# Patient Record
Sex: Male | Born: 1985 | Race: Black or African American | Hispanic: No | Marital: Single | Smoking: Current every day smoker
Health system: Southern US, Community
[De-identification: ages and names within clinical notes are randomized; demographics above are authoritative.]

---

## 2004-02-15 ENCOUNTER — Emergency Department (HOSPITAL_COMMUNITY): Admission: EM | Admit: 2004-02-15 | Discharge: 2004-02-15 | Payer: Self-pay

## 2005-03-07 ENCOUNTER — Emergency Department (HOSPITAL_COMMUNITY): Admission: EM | Admit: 2005-03-07 | Discharge: 2005-03-07 | Payer: Self-pay | Admitting: Emergency Medicine

## 2005-06-20 ENCOUNTER — Emergency Department (HOSPITAL_COMMUNITY): Admission: EM | Admit: 2005-06-20 | Discharge: 2005-06-20 | Payer: Self-pay | Admitting: Emergency Medicine

## 2005-08-14 ENCOUNTER — Emergency Department (HOSPITAL_COMMUNITY): Admission: EM | Admit: 2005-08-14 | Discharge: 2005-08-14 | Payer: Self-pay | Admitting: Emergency Medicine

## 2006-03-25 ENCOUNTER — Emergency Department (HOSPITAL_COMMUNITY): Admission: EM | Admit: 2006-03-25 | Discharge: 2006-03-25 | Payer: Self-pay | Admitting: Family Medicine

## 2006-10-24 ENCOUNTER — Emergency Department (HOSPITAL_COMMUNITY): Admission: EM | Admit: 2006-10-24 | Discharge: 2006-10-24 | Payer: Self-pay | Admitting: Family Medicine

## 2006-12-26 ENCOUNTER — Emergency Department (HOSPITAL_COMMUNITY): Admission: EM | Admit: 2006-12-26 | Discharge: 2006-12-26 | Payer: Self-pay | Admitting: Emergency Medicine

## 2007-06-18 ENCOUNTER — Emergency Department (HOSPITAL_COMMUNITY): Admission: EM | Admit: 2007-06-18 | Discharge: 2007-06-18 | Payer: Self-pay | Admitting: Emergency Medicine

## 2007-07-18 ENCOUNTER — Emergency Department (HOSPITAL_COMMUNITY): Admission: EM | Admit: 2007-07-18 | Discharge: 2007-07-18 | Payer: Self-pay | Admitting: Emergency Medicine

## 2007-08-24 ENCOUNTER — Emergency Department (HOSPITAL_COMMUNITY): Admission: EM | Admit: 2007-08-24 | Discharge: 2007-08-24 | Payer: Self-pay | Admitting: Family Medicine

## 2009-05-12 ENCOUNTER — Emergency Department (HOSPITAL_COMMUNITY): Admission: EM | Admit: 2009-05-12 | Discharge: 2009-05-12 | Payer: Self-pay | Admitting: Emergency Medicine

## 2011-02-09 LAB — URINE CULTURE: Colony Count: 10000

## 2011-02-09 LAB — POCT URINALYSIS DIP (DEVICE)
Glucose, UA: NEGATIVE
Nitrite: NEGATIVE
Operator id: 247071
Protein, ur: 30 — AB
Specific Gravity, Urine: 1.025
Urobilinogen, UA: 1
pH: 6

## 2011-02-09 LAB — GC/CHLAMYDIA PROBE AMP, GENITAL
Chlamydia, DNA Probe: NEGATIVE
GC Probe Amp, Genital: NEGATIVE

## 2011-03-01 LAB — GC/CHLAMYDIA PROBE AMP, GENITAL
Chlamydia, DNA Probe: NEGATIVE
GC Probe Amp, Genital: POSITIVE — AB

## 2011-03-04 LAB — GC/CHLAMYDIA PROBE AMP, GENITAL
Chlamydia, DNA Probe: NEGATIVE
GC Probe Amp, Genital: NEGATIVE

## 2011-03-04 LAB — RPR: RPR Ser Ql: NONREACTIVE

## 2011-03-04 LAB — HIV ANTIBODY (ROUTINE TESTING W REFLEX): HIV: NONREACTIVE

## 2011-05-01 ENCOUNTER — Encounter: Payer: Self-pay | Admitting: Emergency Medicine

## 2011-05-01 ENCOUNTER — Emergency Department (HOSPITAL_COMMUNITY)
Admission: EM | Admit: 2011-05-01 | Discharge: 2011-05-02 | Disposition: A | Discharge status: Home / Self Care | Payer: Self-pay | Attending: Emergency Medicine | Admitting: Emergency Medicine

## 2011-05-01 DIAGNOSIS — R509 Fever, unspecified: Secondary | ICD-10-CM | POA: Insufficient documentation

## 2011-05-01 DIAGNOSIS — J069 Acute upper respiratory infection, unspecified: Secondary | ICD-10-CM | POA: Insufficient documentation

## 2011-05-01 DIAGNOSIS — R0602 Shortness of breath: Secondary | ICD-10-CM | POA: Insufficient documentation

## 2011-05-01 MED ORDER — ACETAMINOPHEN 325 MG PO TABS
650.0000 mg | ORAL_TABLET | Freq: Once | ORAL | Status: AC
  Administered 2011-05-02: 650 mg via ORAL
  Filled 2011-05-01: qty 2

## 2011-05-01 MED ORDER — ALBUTEROL SULFATE (5 MG/ML) 0.5% IN NEBU
5.0000 mg | INHALATION_SOLUTION | Freq: Once | RESPIRATORY_TRACT | Status: AC
  Administered 2011-05-02: 5 mg via RESPIRATORY_TRACT
  Filled 2011-05-01: qty 1

## 2011-05-01 MED ORDER — PREDNISONE 20 MG PO TABS
60.0000 mg | ORAL_TABLET | Freq: Once | ORAL | Status: AC
  Administered 2011-05-02: 60 mg via ORAL
  Filled 2011-05-01: qty 3
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
## 2011-05-02 ENCOUNTER — Emergency Department (HOSPITAL_COMMUNITY): Payer: Self-pay

## 2011-05-02 MED ORDER — ALBUTEROL SULFATE HFA 108 (90 BASE) MCG/ACT IN AERS: 2.0000 | INHALATION_SPRAY | RESPIRATORY_TRACT | Status: DC | PRN

## 2011-05-02 MED ORDER — PREDNISONE 20 MG PO TABS: 60.0000 mg | ORAL_TABLET | Freq: Every day | ORAL | Status: AC

## 2011-05-02 NOTE — ED Notes (Signed)
Pt states he feels much better after breathing tx, even after ambulating.  States sob greatly decreased.

## 2011-05-02 NOTE — ED Notes (Signed)
Pt or family not in room.  They were seen walking out past CDU.

## 2011-05-02 NOTE — ED Notes (Signed)
Dr Dierdre Highman and charge RN is aware that pt has eloped.

## 2011-05-02 NOTE — ED Notes (Signed)
It appears pt has left.

## 2011-05-05 ENCOUNTER — Encounter (HOSPITAL_COMMUNITY): Payer: Self-pay | Admitting: Emergency Medicine

## 2011-05-05 ENCOUNTER — Emergency Department (INDEPENDENT_AMBULATORY_CARE_PROVIDER_SITE_OTHER)
Admission: EM | Admit: 2011-05-05 | Discharge: 2011-05-05 | Disposition: A | Discharge status: Home / Self Care | Payer: Self-pay | Source: Home / Self Care | Attending: Family Medicine | Admitting: Family Medicine

## 2011-05-05 DIAGNOSIS — J40 Bronchitis, not specified as acute or chronic: Secondary | ICD-10-CM

## 2011-05-05 MED ORDER — ALBUTEROL SULFATE HFA 108 (90 BASE) MCG/ACT IN AERS
2.0000 | INHALATION_SPRAY | RESPIRATORY_TRACT | Status: DC | PRN
  Administered 2011-05-05: 2 via RESPIRATORY_TRACT

## 2011-05-05 MED ORDER — ALBUTEROL SULFATE HFA 108 (90 BASE) MCG/ACT IN AERS
INHALATION_SPRAY | RESPIRATORY_TRACT | Status: AC
  Filled 2011-05-05: qty 6.7

## 2011-05-05 MED ORDER — PREDNISONE 20 MG PO TABS: 40.0000 mg | ORAL_TABLET | Freq: Every day | ORAL | Status: AC
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
## 2011-05-18 ENCOUNTER — Encounter (HOSPITAL_COMMUNITY): Payer: Self-pay | Admitting: *Deleted

## 2011-05-18 ENCOUNTER — Emergency Department (INDEPENDENT_AMBULATORY_CARE_PROVIDER_SITE_OTHER)
Admission: EM | Admit: 2011-05-18 | Discharge: 2011-05-18 | Disposition: A | Discharge status: Home / Self Care | Payer: Self-pay | Source: Home / Self Care

## 2011-05-18 DIAGNOSIS — J45909 Unspecified asthma, uncomplicated: Secondary | ICD-10-CM

## 2011-05-18 MED ORDER — ALBUTEROL SULFATE (5 MG/ML) 0.5% IN NEBU
INHALATION_SOLUTION | RESPIRATORY_TRACT | Status: AC
  Filled 2011-05-18: qty 1

## 2011-05-18 MED ORDER — SODIUM CHLORIDE 0.9 % IJ SOLN
INTRAMUSCULAR | Status: AC
  Filled 2011-05-18: qty 3

## 2011-05-18 MED ORDER — ALBUTEROL SULFATE (5 MG/ML) 0.5% IN NEBU
5.0000 mg | INHALATION_SOLUTION | Freq: Once | RESPIRATORY_TRACT | Status: AC
  Administered 2011-05-18: 5 mg via RESPIRATORY_TRACT

## 2011-05-18 MED ORDER — IPRATROPIUM BROMIDE 0.02 % IN SOLN
0.5000 mg | Freq: Once | RESPIRATORY_TRACT | Status: AC
  Administered 2011-05-18: 0.5 mg via RESPIRATORY_TRACT

## 2011-05-18 MED ORDER — ALBUTEROL SULFATE HFA 108 (90 BASE) MCG/ACT IN AERS: 2.0000 | INHALATION_SPRAY | RESPIRATORY_TRACT | Status: DC | PRN

## 2011-05-18 MED ORDER — FLUTICASONE PROPIONATE HFA 110 MCG/ACT IN AERO: 2.0000 | INHALATION_SPRAY | Freq: Two times a day (BID) | RESPIRATORY_TRACT | Status: DC
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
## 2011-05-19 ENCOUNTER — Encounter (HOSPITAL_COMMUNITY): Payer: Self-pay | Admitting: Emergency Medicine

## 2011-05-19 ENCOUNTER — Emergency Department (HOSPITAL_COMMUNITY)
Admission: EM | Admit: 2011-05-19 | Discharge: 2011-05-19 | Disposition: A | Discharge status: Home / Self Care | Payer: Self-pay | Attending: Emergency Medicine | Admitting: Emergency Medicine

## 2011-05-19 DIAGNOSIS — J45901 Unspecified asthma with (acute) exacerbation: Secondary | ICD-10-CM | POA: Insufficient documentation

## 2011-05-19 DIAGNOSIS — J069 Acute upper respiratory infection, unspecified: Secondary | ICD-10-CM | POA: Insufficient documentation

## 2011-05-19 DIAGNOSIS — R0602 Shortness of breath: Secondary | ICD-10-CM | POA: Insufficient documentation

## 2011-05-19 MED ORDER — PREDNISONE 20 MG PO TABS: ORAL_TABLET | ORAL | Status: AC

## 2011-05-19 MED ORDER — ALBUTEROL SULFATE HFA 108 (90 BASE) MCG/ACT IN AERS
2.0000 | INHALATION_SPRAY | RESPIRATORY_TRACT | Status: DC | PRN
  Administered 2011-05-19: 2 via RESPIRATORY_TRACT
  Filled 2011-05-19: qty 6.7

## 2011-05-19 NOTE — ED Notes (Signed)
Mask given to pt to wear.  
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
## 2011-05-19 NOTE — ED Notes (Signed)
3-4  Weeks went to ucc and was seen but did not have $ to get rx filled. Pt  States was dx w/ bronchititis

## 2011-05-24 NOTE — ED Provider Notes (Signed)
Medical screening examination/treatment/procedure(s) were performed by non-physician practitioner and as supervising physician I was immediately available for consultation/collaboration.  Raynald Blend, MD 05/24/11 [phone]

## 2011-06-09 ENCOUNTER — Encounter (HOSPITAL_COMMUNITY): Payer: Self-pay

## 2011-06-09 ENCOUNTER — Emergency Department (INDEPENDENT_AMBULATORY_CARE_PROVIDER_SITE_OTHER)
Admission: EM | Admit: 2011-06-09 | Discharge: 2011-06-09 | Disposition: A | Discharge status: Home / Self Care | Payer: Self-pay | Source: Home / Self Care | Attending: Emergency Medicine | Admitting: Emergency Medicine

## 2011-06-09 DIAGNOSIS — Z202 Contact with and (suspected) exposure to infections with a predominantly sexual mode of transmission: Secondary | ICD-10-CM

## 2011-06-09 MED ORDER — CEFTRIAXONE SODIUM 250 MG IJ SOLR
250.0000 mg | Freq: Once | INTRAMUSCULAR | Status: AC
  Administered 2011-06-09: 250 mg via INTRAMUSCULAR

## 2011-06-09 MED ORDER — CEFTRIAXONE SODIUM 250 MG IJ SOLR
INTRAMUSCULAR | Status: AC
  Filled 2011-06-09: qty 250

## 2011-06-09 MED ORDER — LIDOCAINE HCL (PF) 1 % IJ SOLN
INTRAMUSCULAR | Status: AC
  Filled 2011-06-09: qty 5

## 2011-06-09 MED ORDER — AZITHROMYCIN 250 MG PO TABS
ORAL_TABLET | ORAL | Status: AC
  Filled 2011-06-09: qty 4

## 2011-06-09 MED ORDER — AZITHROMYCIN 250 MG PO TABS
1000.0000 mg | ORAL_TABLET | Freq: Once | ORAL | Status: AC
  Administered 2011-06-09: 1000 mg via ORAL

## 2011-06-09 NOTE — ED Notes (Signed)
Pt states he has been having syx for 1 week
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
## 2011-06-10 LAB — GC/CHLAMYDIA PROBE AMP, GENITAL
Chlamydia, DNA Probe: NEGATIVE
GC Probe Amp, Genital: NEGATIVE

## 2012-01-22 ENCOUNTER — Encounter (HOSPITAL_COMMUNITY): Payer: Self-pay | Admitting: Physical Medicine and Rehabilitation

## 2012-01-22 ENCOUNTER — Encounter (HOSPITAL_COMMUNITY): Payer: Self-pay | Admitting: Emergency Medicine

## 2012-01-22 ENCOUNTER — Emergency Department (HOSPITAL_COMMUNITY)
Admission: EM | Admit: 2012-01-22 | Discharge: 2012-01-22 | Disposition: A | Discharge status: Home / Self Care | Payer: Self-pay | Attending: Emergency Medicine | Admitting: Emergency Medicine

## 2012-01-22 ENCOUNTER — Emergency Department (HOSPITAL_COMMUNITY)
Admission: EM | Admit: 2012-01-22 | Discharge: 2012-01-22 | Discharge status: Against Medical Advice | Payer: Self-pay | Attending: Emergency Medicine | Admitting: Emergency Medicine

## 2012-01-22 DIAGNOSIS — Z202 Contact with and (suspected) exposure to infections with a predominantly sexual mode of transmission: Secondary | ICD-10-CM | POA: Insufficient documentation

## 2012-01-22 DIAGNOSIS — R369 Urethral discharge, unspecified: Secondary | ICD-10-CM | POA: Insufficient documentation

## 2012-01-22 DIAGNOSIS — Z87891 Personal history of nicotine dependence: Secondary | ICD-10-CM | POA: Insufficient documentation

## 2012-01-22 LAB — URINALYSIS, ROUTINE W REFLEX MICROSCOPIC
Bilirubin Urine: NEGATIVE
Ketones, ur: NEGATIVE mg/dL
Nitrite: NEGATIVE
Specific Gravity, Urine: 1.029 (ref 1.005–1.030)
Urobilinogen, UA: 1 mg/dL (ref 0.0–1.0)
pH: 6.5 (ref 5.0–8.0)

## 2012-01-22 LAB — URINE MICROSCOPIC-ADD ON

## 2012-01-22 MED ORDER — CEFTRIAXONE SODIUM 250 MG IJ SOLR
250.0000 mg | Freq: Once | INTRAMUSCULAR | Status: AC
  Administered 2012-01-22: 250 mg via INTRAMUSCULAR
  Filled 2012-01-22: qty 250

## 2012-01-22 MED ORDER — AZITHROMYCIN 250 MG PO TABS
1000.0000 mg | ORAL_TABLET | Freq: Once | ORAL | Status: AC
  Administered 2012-01-22: 1000 mg via ORAL
  Filled 2012-01-22: qty 4

## 2012-01-22 NOTE — ED Notes (Signed)
U/a completed today at [facility] ED

## 2012-01-22 NOTE — ED Provider Notes (Signed)
Medical screening examination/treatment/procedure(s) were performed by non-physician practitioner and as supervising physician I was immediately available for consultation/collaboration.   Celene Kras, MD 01/22/12 [phone]

## 2012-01-22 NOTE — ED Notes (Signed)
Pt c/o yellow penile d/c x 2 days. Also c/o burning with urination. girlfriend recently treated for Chlamydia.

## 2012-01-22 NOTE — ED Provider Notes (Signed)
History     CSN: [id number]  Arrival date & time 01/22/12  2050   First MD Initiated Contact with Patient 01/22/12 2200      Chief Complaint  Patient presents with  . Penile Discharge    (Consider location/radiation/quality/duration/timing/severity/associated sxs/prior treatment) HPI Comments: Patient presents with penile discharge for the past 4 days. He reports his girlfriend recently informing him that she has been diagnosed with Chlamydia. Patient also experiencing dysuria. He has not had any treatment since the symptoms began.   Patient is a 26 y.o. male presenting with penile discharge.  Penile Discharge    History reviewed. No pertinent past medical history.  History reviewed. No pertinent past surgical history.  No family history on file.  History  Substance Use Topics  . Smoking status: Former Games developer  . Smokeless tobacco: Not on file  . Alcohol Use: Yes     occasional      Review of Systems  Genitourinary: Positive for dysuria and discharge.  All other systems reviewed and are negative.    Allergies  Review of patient's allergies indicates no known allergies.  Home Medications  No current outpatient prescriptions on file.  BP 116/77  Pulse 85  Temp 98.6 F (37 C) (Oral)  Resp 18  SpO2 98%  Physical Exam  Nursing note and vitals reviewed. Constitutional: He is oriented to person, place, and time. He appears well-developed and well-nourished. No distress.  HENT:  Head: Normocephalic and atraumatic.  Eyes: Conjunctivae are normal. No scleral icterus.  Neck: Normal range of motion. Neck supple.  Musculoskeletal: Normal range of motion.  Neurological: He is alert and oriented to person, place, and time.  Skin: Skin is warm and dry. He is not diaphoretic.  Psychiatric: He has a normal mood and affect. His behavior is normal.    ED Course  Procedures (including critical care time)  Labs Reviewed - No data to display No results found.   1.  Exposure to STD       MDM  10:44 PM Patient treated empirically for GC/Chlamydia exposure. He is symptomatic with a known exposure. He should follow up with the health department as needed. No further evaluation needed at this time.        Emilia Beck, PA-C 01/22/12 2248

## 2012-01-22 NOTE — ED Notes (Signed)
Pt presents to department for evaluation of penile discharge. States he wants to get check for possible STD, states his girlfriend recently told him he could have chlamydia. Symptoms ongoing x4 days. Also states burning with urination. No signs of distress noted at the time.

## 2012-04-07 ENCOUNTER — Encounter (HOSPITAL_COMMUNITY): Payer: Self-pay | Admitting: Emergency Medicine

## 2012-04-07 ENCOUNTER — Emergency Department (HOSPITAL_COMMUNITY)
Admission: EM | Admit: 2012-04-07 | Discharge: 2012-04-07 | Disposition: A | Discharge status: Home / Self Care | Payer: Self-pay | Attending: Emergency Medicine | Admitting: Emergency Medicine

## 2012-04-07 DIAGNOSIS — S025XXA Fracture of tooth (traumatic), initial encounter for closed fracture: Secondary | ICD-10-CM | POA: Insufficient documentation

## 2012-04-07 DIAGNOSIS — K0889 Other specified disorders of teeth and supporting structures: Secondary | ICD-10-CM

## 2012-04-07 DIAGNOSIS — X58XXXA Exposure to other specified factors, initial encounter: Secondary | ICD-10-CM | POA: Insufficient documentation

## 2012-04-07 DIAGNOSIS — R51 Headache: Secondary | ICD-10-CM | POA: Insufficient documentation

## 2012-04-07 DIAGNOSIS — Y9389 Activity, other specified: Secondary | ICD-10-CM | POA: Insufficient documentation

## 2012-04-07 DIAGNOSIS — Y929 Unspecified place or not applicable: Secondary | ICD-10-CM | POA: Insufficient documentation

## 2012-04-07 DIAGNOSIS — K089 Disorder of teeth and supporting structures, unspecified: Secondary | ICD-10-CM | POA: Insufficient documentation

## 2012-04-07 DIAGNOSIS — Z87891 Personal history of nicotine dependence: Secondary | ICD-10-CM | POA: Insufficient documentation

## 2012-04-07 MED ORDER — OXYCODONE-ACETAMINOPHEN 5-325 MG PO TABS: 1.0000 | ORAL_TABLET | ORAL | Status: DC | PRN

## 2012-04-07 NOTE — ED Notes (Signed)
Patient said he was eating something he does not know what he was eating.  This happened three days ago, but what brought him in today was his pain.

## 2012-04-07 NOTE — ED Notes (Signed)
Patient is alert and orientedx4.  Patient was explained discharge instructions and he understood them with no questions. 

## 2012-04-07 NOTE — ED Notes (Signed)
Pt sts that he had a tooth that cracked yesterday and has been causing him pain.

## 2012-04-07 NOTE — ED Provider Notes (Signed)
History     CSN: [id number]  Arrival date & time 04/07/12  4098   First MD Initiated Contact with Patient 04/07/12 2130      Chief Complaint  Patient presents with  . Dental Pain    (Consider location/radiation/quality/duration/timing/severity/associated sxs/prior treatment) The history is provided by the patient and medical records.    Timothy Sullivan is a 26 y.o. male presents to the emergency department c/o dental pain.  Pain began 2 days ago acutely, has been persistent and stabilized.  Pt has associated headache.  He has not tried to take anything.  Pt denies fever, chills, neck pain, neck stiffness, abdominal pain, nausea, vomiting, diarrhea, bleeding from the tooth or gums, pus or swelling of the mouth.  Pt states he was eating dinner when the tooth broke.  He does not have regular dentist.  He has not had problems with this tooth before.    No past medical history on file.  No past surgical history on file.  No family history on file.  History  Substance Use Topics  . Smoking status: Former Games developer  . Smokeless tobacco: Not on file  . Alcohol Use: Yes     Comment: occasional      Review of Systems  Constitutional: Negative for fever, chills and appetite change.  HENT: Positive for dental problem. Negative for ear pain, nosebleeds, facial swelling, rhinorrhea, drooling, trouble swallowing, neck pain, neck stiffness and postnasal drip.   Eyes: Negative for pain and redness.  Respiratory: Negative for cough and wheezing.   Cardiovascular: Negative for chest pain.  Gastrointestinal: Negative for nausea, vomiting and abdominal pain.  Skin: Negative for color change and rash.  Neurological: Positive for headaches. Negative for weakness and light-headedness.  All other systems reviewed and are negative.    Allergies  Review of patient's allergies indicates no known allergies.  Home Medications   Current Outpatient Rx  Name  Route  Sig  Dispense  Refill  .  OXYCODONE-ACETAMINOPHEN 5-325 MG PO TABS   Oral   Take 1 tablet by mouth every 4 (four) hours as needed for pain.   20 tablet   0     BP 147/88  Pulse 110  Temp 98 F (36.7 C) (Oral)  Resp 18  SpO2 100%  Physical Exam  Nursing note and vitals reviewed. Constitutional: He appears well-developed and well-nourished.  HENT:  Head: Normocephalic and atraumatic.  Right Ear: Tympanic membrane, external ear and ear canal normal.  Left Ear: Tympanic membrane, external ear and ear canal normal.  Nose: Nose normal. Right sinus exhibits no maxillary sinus tenderness and no frontal sinus tenderness. Left sinus exhibits no maxillary sinus tenderness and no frontal sinus tenderness.  Mouth/Throat: Uvula is midline, oropharynx is clear and moist and mucous membranes are normal. No oral lesions. Abnormal dentition. Dental caries present. No dental abscesses, uvula swelling or lacerations. No oropharyngeal exudate, posterior oropharyngeal edema, posterior oropharyngeal erythema or tonsillar abscesses.    Eyes: Conjunctivae normal are normal. Right eye exhibits no discharge. Left eye exhibits no discharge.  Neck: Normal range of motion. Neck supple.  Cardiovascular: Normal rate, regular rhythm and normal heart sounds.  Exam reveals no gallop and no friction rub.   No murmur heard. Pulmonary/Chest: Effort normal and breath sounds normal. No respiratory distress. He has no wheezes.  Lymphadenopathy:    He has no cervical adenopathy.  Neurological: He is alert.  Skin: Skin is warm and dry. No rash noted.  Psychiatric: He has a normal  mood and affect.    ED Course  Dental Date/Time: 04/07/2012 10:05 PM Performed by: Dierdre Forth Authorized by: Dierdre Forth Consent: Verbal consent obtained. Risks and benefits: risks, benefits and alternatives were discussed Consent given by: patient Patient understanding: patient states understanding of the procedure being performed Patient  consent: the patient's understanding of the procedure matches consent given Required items: required blood products, implants, devices, and special equipment available Patient identity confirmed: verbally with patient Time out: Immediately prior to procedure a "time out" was called to verify the correct patient, procedure, equipment, support staff and site/side marked as required. Preparation: Patient was prepped and draped in the usual sterile fashion. Local anesthesia used: yes Anesthesia: nerve block Local anesthetic: bupivacaine 0.5% with epinephrine Anesthetic total: 0.5 ml Patient sedated: no Patient tolerance: Patient tolerated the procedure well with no immediate complications. Comments: Nerve block for tooth #14   (including critical care time)  Labs Reviewed - No data to display No results found.  10:41 PM Pt given dental block and epoxy applied to the broken tooth  1. Pain, dental   2. Broken tooth       MDM  Timothy Sullivan presents with dental pain.  Patient given dental block without complications and with resolution of pain. Epoxy applied to the broken tooth. No gross abscess.  Exam unconcerning for Ludwig's angina or spread of infection.  Will treat with pain medicine.  No evidence of infection, erythema or swelling; no Abx given.  Urged patient to follow-up with dentist.  I have also discussed reasons to return immediately to the ER.  Patient expresses understanding and agrees with plan.  1. Medications: percocet 2. Treatment: take medication as prescribed, avoid extremely hot or cold foods 3. Follow Up: with dentistry         Dierdre Forth, PA-C 04/07/12 2242

## 2012-04-08 NOTE — ED Provider Notes (Signed)
Medical screening examination/treatment/procedure(s) were performed by non-physician practitioner and as supervising physician I was immediately available for consultation/collaboration.  Daysi Boggan, MD 04/08/12 1610 

## 2015-01-25 ENCOUNTER — Encounter (HOSPITAL_COMMUNITY): Payer: Self-pay

## 2015-01-25 ENCOUNTER — Emergency Department (HOSPITAL_COMMUNITY)
Admission: EM | Admit: 2015-01-25 | Discharge: 2015-01-25 | Disposition: A | Discharge status: Home / Self Care | Payer: Self-pay | Attending: Emergency Medicine | Admitting: Emergency Medicine

## 2015-01-25 DIAGNOSIS — K029 Dental caries, unspecified: Secondary | ICD-10-CM | POA: Insufficient documentation

## 2015-01-25 DIAGNOSIS — K088 Other specified disorders of teeth and supporting structures: Secondary | ICD-10-CM | POA: Insufficient documentation

## 2015-01-25 DIAGNOSIS — M79672 Pain in left foot: Secondary | ICD-10-CM | POA: Insufficient documentation

## 2015-01-25 DIAGNOSIS — K0889 Other specified disorders of teeth and supporting structures: Secondary | ICD-10-CM

## 2015-01-25 DIAGNOSIS — Z720 Tobacco use: Secondary | ICD-10-CM | POA: Insufficient documentation

## 2015-01-25 MED ORDER — PENICILLIN V POTASSIUM 500 MG PO TABS: 500.0000 mg | ORAL_TABLET | Freq: Three times a day (TID) | ORAL | Status: AC

## 2015-01-25 MED ORDER — IBUPROFEN 600 MG PO TABS: 600.0000 mg | ORAL_TABLET | Freq: Three times a day (TID) | ORAL | Status: DC | PRN

## 2015-01-25 NOTE — ED Provider Notes (Signed)
CSN: [id number]     Arrival date & time 01/25/15  4098 History   First MD Initiated Contact with Patient 01/25/15 [phone]     Chief Complaint  Patient presents with  . Dental Pain  . Foot Pain     HPI Patient presents with dental pain over the past several weeks.  He localizes his pain to tooth #27.  Denies facial swelling.  No fevers.  No difficulty breathing or swallowing.  Patient has not seen a dentist this point.  Start Tylenol without improvement in his symptoms.  History reviewed. No pertinent past medical history. History reviewed. No pertinent past surgical history. History reviewed. No pertinent family history. Social History  Substance Use Topics  . Smoking status: Current Every Day Smoker -- 2.00 packs/day    Types: Cigarettes  . Smokeless tobacco: None  . Alcohol Use: Yes     Comment: occasional    Review of Systems  All other systems reviewed and are negative.     Allergies  Review of patient's allergies indicates no known allergies.  Home Medications   Prior to Admission medications   Medication Sig Start Date End Date Taking? Authorizing Provider  ibuprofen (ADVIL,MOTRIN) 600 MG tablet Take 1 tablet (600 mg total) by mouth every 8 (eight) hours as needed. 01/25/15   Azalia Bilis, MD  oxyCODONE-acetaminophen (PERCOCET) 5-325 MG per tablet Take 1 tablet by mouth every 4 (four) hours as needed for pain. 04/07/12   Hannah Muthersbaugh, PA-C  penicillin v potassium (VEETID) 500 MG tablet Take 1 tablet (500 mg total) by mouth 3 (three) times daily. 01/25/15   Azalia Bilis, MD   BP 152/102 mmHg  Pulse 114  Temp(Src) 99.1 F (37.3 C) (Oral)  Resp 18  SpO2 96% Physical Exam  Constitutional: He is oriented to person, place, and time. He appears well-developed and well-nourished.  HENT:  Head: Normocephalic.  Mild tenderness and obvious decay of tooth #27.  No gingival swelling or fluctuance.  Tolerating secretions.  Oral airway patent.  Eyes: EOM are normal.   Neck: Normal range of motion.  Pulmonary/Chest: Effort normal.  Abdominal: He exhibits no distension.  Musculoskeletal: Normal range of motion.  Neurological: He is alert and oriented to person, place, and time.  Psychiatric: He has a normal mood and affect.  Nursing note and vitals reviewed.   ED Course  Procedures (including critical care time) Labs Review Labs Reviewed - No data to display  Imaging Review No results found. I have personally reviewed and evaluated these images and lab results as part of my medical decision-making.   EKG Interpretation None      MDM   Final diagnoses:  Pain, dental   Dental Pain. Home with antibiotics and pain medicine. Recommend dental follow up. No signs of gingival abscess. Tolerating secretions. Airway patent. No sub lingular swelling     Azalia Bilis, MD 01/25/15 [phone]

## 2015-01-25 NOTE — ED Notes (Signed)
Pt c/o L lower dental pain x "a couple of weeks" and L foot pain/swelling x "a month."  Pain score 6/10.  No swelling noted to foot.  Skin is peeling around toes similar to Athlete's foot.

## 2015-01-25 NOTE — Discharge Instructions (Signed)
Dental Pain °A tooth ache may be caused by cavities (tooth decay). Cavities expose the nerve of the tooth to air and hot or cold temperatures. It may come from an infection or abscess (also called a boil or furuncle) around your tooth. It is also often caused by dental caries (tooth decay). This causes the pain you are having. °DIAGNOSIS  °Your caregiver can diagnose this problem by exam. °TREATMENT  °· If caused by an infection, it may be treated with medications which kill germs (antibiotics) and pain medications as prescribed by your caregiver. Take medications as directed. °· Only take over-the-counter or prescription medicines for pain, discomfort, or fever as directed by your caregiver. °· Whether the tooth ache today is caused by infection or dental disease, you should see your dentist as soon as possible for further care. °SEEK MEDICAL CARE IF: °The exam and treatment you received today has been provided on an emergency basis only. This is not a substitute for complete medical or dental care. If your problem worsens or new problems (symptoms) appear, and you are unable to meet with your dentist, call or return to this location. °SEEK IMMEDIATE MEDICAL CARE IF:  °· You have a fever. °· You develop redness and swelling of your face, jaw, or neck. °· You are unable to open your mouth. °· You have severe pain uncontrolled by pain medicine. °MAKE SURE YOU:  °· Understand these instructions. °· Will watch your condition. °· Will get help right away if you are not doing well or get worse. °Document Released: 05/03/2005 Document Revised: 07/26/2011 Document Reviewed: 12/20/2007 °ExitCare® Patient Information ©2015 [employer]. This information is not intended to replace advice given to you by your health care provider. Make sure you discuss any questions you have with your health care provider. ° °Emergency Department Resource Guide °1) Find a Doctor and Pay Out of Pocket °Although you won't have to find out who  is covered by your insurance plan, it is a good idea to ask around and get recommendations. You will then need to call the office and see if the doctor you have chosen will accept you as a new patient and what types of options they offer for patients who are self-pay. Some doctors offer discounts or will set up payment plans for their patients who do not have insurance, but you will need to ask so you aren't surprised when you get to your appointment. ° °2) Contact Your Local Health Department °Not all health departments have doctors that can see patients for sick visits, but many do, so it is worth a call to see if yours does. If you don't know where your local health department is, you can check in your phone book. The CDC also has a tool to help you locate your state's health department, and many state websites also have listings of all of their local health departments. ° °3) Find a Walk-in Clinic °If your illness is not likely to be very severe or complicated, you may want to try a walk in clinic. These are popping up all over the country in pharmacies, drugstores, and shopping centers. They're usually staffed by nurse practitioners or physician assistants that have been trained to treat common illnesses and complaints. They're usually fairly quick and inexpensive. However, if you have serious medical issues or chronic medical problems, these are probably not your best option. ° °No Primary Care Doctor: °- Call Health Connect at  832-8000 - they can help you locate a primary   care doctor that  accepts your insurance, provides certain services, etc. °- Physician Referral Service- 1-800-533-3463 ° °Chronic Pain Problems: °Organization         Address  Phone   Notes  °[facility]  (336) 297-2271 Patients need to be referred by their primary care doctor.  ° °Medication Assistance: °Organization         Address  Phone   Notes  °[place] [facility] [address]  [address] °[place], [place] [address] (336) 641-8030 --Must be a resident of [place] °-- Must have NO insurance coverage whatsoever (no Medicaid/ Medicare, etc.) °-- The pt. MUST have a primary care doctor that directs their care regularly and follows them in the community °  °[facility]  (866) 331-1348   °[employer]  (888) 892-1162   ° °Agencies that provide inexpensive medical care: °Organization         Address  Phone   Notes  °[facility]  (336) 832-8035   °[facility]    (336) 832-7272   °[facility] [address] °[place], [place] [address] (336) 832-4777   °[facility] [address], °[place] (336) 271-4999   °[facility]    (336) 373-0678   °[facility]    (336) 272-1050   °[facility] ° [address], [place] Phone:  (336) 832-4444, Fax:  (336) 832-4440 Hours of Operation:  9 am - 6 pm, M-F.  Also accepts Medicaid/Medicare and self-pay.  °[facility] ° [address], [place] Phone: (336) 832-3150, Fax: (336) 832-3151. Hours of Operation:  8:30 am - 5:30 pm, M-F.  Also accepts Medicaid and self-pay.  °[facility] [place] [address], [place] Phone: (336) 878-6027   °[facility] [address], [place], [place] (336)723-1848, Ext. 123 Mondays & Thursdays: 7-9 AM.  First 15 patients are seen on a first come, first serve basis. °  ° °Medicaid-accepting [place] Providers: ° °Organization         Address  Phone   Notes  °[facility] [address], [place] (336) 641-2100 Also accepts self-pay patients.  °[facility] [address], [place] ° (336) 856-9996   °[facility] [address], [place] (336) 288-8857   °[facility] [address] [place] [address], [place] (336) 299-7000   °Veita Bland [address], [place]  ° (336) 373-1557 Only accepts [place] Access Medicaid patients after they have their name applied to their card.  ° °Self-Pay (no insurance) in [place]: ° °Organization         Address  Phone   Notes  °Sickle Cell Patients, [facility] [address], [place] (336) 832-1970   °[facility] [address], [place] (336) 832-4400   °[facility] ° [address], [place] (336) 992-4800   °[facility]/Dr. Osei-Bonsu ° [address] [place] [address], [place] or [address], [place] (336) 841-8500 Phone number for both [place] and [place] locations is the same.  °[facility] [address], [place] (336) 299-0000   °[facility] [address] [place] [address],  or [address] (336) 852-7530 °(336) 878-2260   °  [facility] [address], [place] (336) 350-1642, phone; (336) 294-5005, fax Sees patients 1st and 3rd Saturday of every month.  Must not qualify for public or private insurance (i.e. Medicaid, Medicare, [place] Health Choice, Veterans' Benefits) • Household income should be no more than 200% of the poverty level •The clinic cannot treat you if you are pregnant or think you are pregnant • Sexually transmitted diseases are not treated at the clinic.  ° ° °Dental Care: °Organization         Address  Phone  Notes  °[facility] [address], [place] (336) 641-6152 Accepts children up to age 21 who are enrolled in Medicaid or [place] Health Choice; pregnant women with a Medicaid card; and children who have applied for Medicaid or [place] Health Choice, but were declined, whose parents can pay a reduced fee at time of service.  °[facility] [place]  [address], [place] (336) 641-7733 Accepts children up to age 21 who are  enrolled in Medicaid or [place] Health Choice; pregnant women with a Medicaid card; and children who have applied for Medicaid or [place] Health Choice, but were declined, whose parents can pay a reduced fee at time of service.  °[facility] ° [address], [place] (336) 641-4533 Patients are seen by appointment only. Walk-ins are not accepted. [facility] will see patients 18 years of age and older. °Monday - Tuesday (8am-5pm) °Most Wednesdays (8:30-5pm) °$30 per visit, cash only  °[facility] ° [address], [place] (336) 641-4533 Patients are seen by appointment only. Walk-ins are not accepted. [facility] will see patients 18 years of age and older. °One Wednesday Evening (Monthly: Volunteer Based).  $30 per visit, cash only  °[facility]  (919) 537-3737 for adults; Children under age 4, call Graduate Pediatric Dentistry at (919) [phone]. Children aged 4-14, please call (919) [phone] to request a pediatric application. ° Dental services are provided in all areas of dental care including fillings, crowns and bridges, complete and partial dentures, implants, gum treatment, root canals, and extractions. Preventive care is also provided. Treatment is provided to both adults and children. °Patients are selected via a lottery and there is often a waiting list. °  °[facility] [address], ° ° (336) 763-8833 [email or web address] °  °[facility] [address], [place], [place] (336)723-1848, Ext. 123 Second and Fourth Thursday of each month, opens at 6:30 AM; Clinic ends at 9 AM.  Patients are seen on a first-come first-served basis, and a limited number are seen during each clinic.  ° °[facility] ° [address], [place], [place] (336) 723-7904   Eligibility Requirements °You must have lived in [place], [place], or [place] counties for at least the last three months. °  You  cannot be eligible for state or federal sponsored healthcare insurance, including Veterans Administration, Medicaid, or Medicare. °  You generally cannot be eligible for healthcare insurance through your employer.  °  How to apply: °Eligibility screenings are held every Tuesday and Wednesday afternoon from 1:00 pm until 4:00 pm. You do not need an appointment for the interview!  °[facility] [address], [place], [place] 336-631-2330   °[facility] [facility]  336-342-8273   °[place]  336-703-3100   °[facility]  336-570-6415   ° °Behavioral Health Resources in the Community: °Intensive Outpatient Programs °Organization         Address  Phone  Notes  °[facility] [address], [place], [place] 336-878-6098   °[facility] [address], [place], [place] 336-832-9800   °[facility] [address], [place], [place] ° 336-882-2125   °[facility] [address],  °[place], [place] 1-800-853-5163 or 336-641-4981   °Substance Abuse Resources °Organization         Address  Phone  Notes  °[facility]  336-882-2125   °[facility]  336-784-9470   °[facility]  336-285-9073   °[facility]  336-845-3988   °Residential & Outpatient Substance Abuse Program  1-800-659-3381   °Psychological Services °Organization         Address  Phone  Notes  °[facility]  336- 832-9600   °[facility]  336- 378-7881   °[facility] [address], [place] 1-800-853-5163 or 336-641-4981   ° °Mobile Crisis Teams °Organization         Address  Phone  Notes  °[facility], Mobile Crisis Care Unit  1-877-626-1772   °Assertive °[facility] ° [address] [place], [place] 336-834-9664   °Sharon DeEsch [address] °[place] 336-554-5454   ° °Self-Help/Support  Groups °Organization         Address  Phone             Notes  °[facility] - variety of support groups  336- 373-1402 Call for more information  °Narcotics Anonymous (NA), [facility] [address], °[place]  2 meetings at this location  ° °Residential Treatment Programs °Organization         Address  Phone  Notes  °[facility] [address],    °[place]  1-866-801-8205   °[facility] ° [address], [place], [place] 704-293-8524   °[facility] [address], [place] 336-845-3988 Admissions: 8am-3pm M-F  °[facility] [address]    °[place], [place] 336-841-1104   °[facility] [address], [place], [place] 336-379-7146   °[facility] [address]  °[place], [place] 336-285-9073   °[facility] Intensive Outpatient [address], [place], [place] 336-852-3033   °[facility] [address]  °[place], [place] 1-877-615-2722 or 336-784-9470   °[facility] [address] [place], [place] 336-227-7417 Accepts Medicaid  °[facility] [address]  °[place] 1-800-659-3381 Substance Abuse/Addiction Treatment  ° °[place] Behavioral Health Resources °Organization         Address  Phone  Notes  °[facility]  (888) 581-9988   °Julie Brannon, PhD [address] [place], [place]   (336) 349-5553 or (336) 951-0000   °[facility]   [address] °[place],  (336) 349-4454   °[facility] [address], [place],  (336) 342-8316 Insurance/Medicaid/sponsorship through [facility]  °[facility] [address]                                    [place],  (336) 342-8316 Therapy/tele-psych/case  °[facility]   [address]  ° [place], [place] (336) 349-2233    °Dr. Arfeen  (336) 349-4544   °[facility] [place]  [facility]  [employer] [place] [facility] 1) [address], [place] °2) [address], [place] °3)  [address], [place] (336) 349-3220 °(336) 342-7768 ° °(336) 342-8140   °[place] Child Abuse Hot[facility] (336) 342-1394 or (336) 342-3537 (After Hours)    ° ° ° °

## 2015-02-10 ENCOUNTER — Encounter (HOSPITAL_COMMUNITY): Payer: Self-pay | Admitting: Family Medicine

## 2015-02-10 ENCOUNTER — Emergency Department (HOSPITAL_COMMUNITY)
Admission: EM | Admit: 2015-02-10 | Discharge: 2015-02-10 | Disposition: A | Discharge status: Home / Self Care | Payer: Self-pay | Attending: Emergency Medicine | Admitting: Emergency Medicine

## 2015-02-10 DIAGNOSIS — Z720 Tobacco use: Secondary | ICD-10-CM | POA: Insufficient documentation

## 2015-02-10 DIAGNOSIS — L02419 Cutaneous abscess of limb, unspecified: Secondary | ICD-10-CM

## 2015-02-10 DIAGNOSIS — L03116 Cellulitis of left lower limb: Secondary | ICD-10-CM | POA: Insufficient documentation

## 2015-02-10 DIAGNOSIS — L03119 Cellulitis of unspecified part of limb: Secondary | ICD-10-CM

## 2015-02-10 DIAGNOSIS — L02416 Cutaneous abscess of left lower limb: Secondary | ICD-10-CM | POA: Insufficient documentation

## 2015-02-10 MED ORDER — LIDOCAINE-EPINEPHRINE (PF) 2 %-1:200000 IJ SOLN
10.0000 mL | Freq: Once | INTRAMUSCULAR | Status: AC
  Administered 2015-02-10: 10 mL
  Filled 2015-02-10: qty 20

## 2015-02-10 MED ORDER — CEPHALEXIN 500 MG PO CAPS: 500.0000 mg | ORAL_CAPSULE | Freq: Four times a day (QID) | ORAL | Status: AC

## 2015-02-10 MED ORDER — IBUPROFEN 800 MG PO TABS: 800.0000 mg | ORAL_TABLET | Freq: Three times a day (TID) | ORAL | Status: DC | PRN

## 2015-02-10 MED ORDER — SULFAMETHOXAZOLE-TRIMETHOPRIM 800-160 MG PO TABS: 1.0000 | ORAL_TABLET | Freq: Two times a day (BID) | ORAL | Status: AC

## 2015-02-10 NOTE — ED Provider Notes (Signed)
CSN: [id number]     Arrival date & time 02/10/15  0905 History  This chart was scribed for non-physician practitioner, Trixie Dredge, PA-C, working with Elwin Mocha, MD by Marica Otter, ED Scribe. This patient was seen in room TR11C/TR11C and the patient's care was started at 11:20 AM.   Chief Complaint  Patient presents with  . Abscess   The history is provided by the patient. No language interpreter was used.   PCP: PROVIDER NOT IN SYSTEM HPI Comments: Timothy Sullivan is a 29 y.o. male who presents to the Emergency Department complaining of gradually worsening, non-draining abscess with associated pain and itching to left lower leg onset 2 days ago. Pt denies fever, weakness/numbness to LE, chest pain, SOB. Pt reports his last tetanus shot was within the past 5 years.   History reviewed. No pertinent past medical history. History reviewed. No pertinent past surgical history. History reviewed. No pertinent family history. Social History  Substance Use Topics  . Smoking status: Current Every Day Smoker -- 2.00 packs/day    Types: Cigarettes  . Smokeless tobacco: None  . Alcohol Use: Yes     Comment: occasional    Review of Systems  Constitutional: Negative for fever.  Respiratory: Negative for shortness of breath.   Cardiovascular: Negative for chest pain.  Musculoskeletal: Positive for arthralgias (abscess with associated pain and itching to left lower leg ).  Skin: Positive for wound (abscess to LLE ).  Neurological: Negative for weakness and numbness.   Allergies  Review of patient's allergies indicates no known allergies.  Home Medications   Prior to Admission medications   Medication Sig Start Date End Date Taking? Authorizing Provider  ibuprofen (ADVIL,MOTRIN) 600 MG tablet Take 1 tablet (600 mg total) by mouth every 8 (eight) hours as needed. 01/25/15   Azalia Bilis, MD  oxyCODONE-acetaminophen (PERCOCET) 5-325 MG per tablet Take 1 tablet by mouth every 4 (four) hours as  needed for pain. 04/07/12   Cylas Falzone Muthersbaugh, PA-C  penicillin v potassium (VEETID) 500 MG tablet Take 1 tablet (500 mg total) by mouth 3 (three) times daily. 01/25/15   Azalia Bilis, MD   Triage Vitals: BP 132/86 mmHg  Pulse 85  Temp(Src) 98.4 F (36.9 C) (Oral)  Resp 20  SpO2 97% Physical Exam  Constitutional: He appears well-developed and well-nourished. No distress.  HENT:  Head: Normocephalic and atraumatic.  Neck: Neck supple.  Pulmonary/Chest: Effort normal.  Neurological: He is alert.  Skin: He is not diaphoretic.  Nursing note and vitals reviewed.   ED Course  Procedures (including critical care time) DIAGNOSTIC STUDIES: Oxygen Saturation is 97% on RA, nl by my interpretation.    COORDINATION OF CARE: 11:21 AM: Discussed treatment plan which includes ultrasound of abscess site with pt at bedside; patient verbalizes understanding and agrees with treatment plan.  Labs Review Labs Reviewed - No data to display  Imaging Review No results found. I have personally reviewed and evaluated these images and lab results as part of my medical decision-making.   EMERGENCY DEPARTMENT US SOFT TISSUE INTERPRETATION "Study: Limited Ultrasound of the noted body part in comments below"  INDICATIONS: Soft tissue infection Multiple views of the body part are obtained with a multi-frequency linear probe  PERFORMED BY:  Myself  IMAGES ARCHIVED?: Yes  SIDE:Left  BODY PART:Lower extremity  FINDINGS: Abcess present and Cellulitis present  LIMITATIONS:  Emergent Procedure  INTERPRETATION:  Abcess present and Cellulitis present  COMMENT:  Left lower leg, anterior/lateral with cellulitis and abscess  INCISION AND DRAINAGE Performed by: Trixie Dredge Consent: Verbal consent obtained. Risks and benefits: risks, benefits and alternatives were discussed Type: abscess  Body area: left lower extremities  Anesthesia: local infiltration  Incision was made with a  scalpel.  Local anesthetic: lidocaine 2% with epinephrine  Anesthetic total: 6  ml  Complexity: complex Blunt dissection to break up loculations  Drainage: purulent  Drainage amount: moderate  Packing material: none  Abscess cavity flushed with  Normal saline  Patient tolerance: Patient tolerated the procedure well with no immediate complications.     MDM   Final diagnoses:  Cellulitis and abscess of leg    Afebrile, nontoxic patient with abscess and cellulitis of left lower extremity.  I&D in ED.  No systemic symptoms.   D/C home with keflex, bactrim, ibuprofen.  Discussed return precautions.  Discussed result, findings, treatment, and follow up  with patient.  Pt given return precautions.  Pt verbalizes understanding and agrees with plan.      I personally performed the services described in this documentation, which was scribed in my presence. The recorded information has been reviewed and is accurate.    Trixie Dredge, PA-C 02/10/15 1405  Elwin Mocha, MD 02/10/15 1524

## 2015-02-10 NOTE — ED Notes (Signed)
Dry gauze and tape applied to incision per [facility].

## 2015-02-10 NOTE — Progress Notes (Signed)
[facility] & Eligibility Specialist [facility] for [facility] [phone]  Spoke to patient regarding primary care resources and the [facility] orange card. Orange card application and instructions provided. Resource guide and my contact information also given for any future questions or concerns. No other [facility]ligibility Specialist needs identified at this time.

## 2015-02-10 NOTE — Discharge Instructions (Signed)
Read the information below.  Use the prescribed medication as directed.  Please discuss all new medications with your pharmacist.  You may return to the Emergency Department at any time for worsening condition or any new symptoms that concern you.    If you develop increased redness, swelling, pus draining from the wound, or fevers greater than 100.4, return to the ER immediately for a recheck.      Abscess Care After An abscess (also called a boil or furuncle) is an infected area that contains a collection of pus. Signs and symptoms of an abscess include pain, tenderness, redness, or hardness, or you may feel a moveable soft area under your skin. An abscess can occur anywhere in the body. The infection may spread to surrounding tissues causing cellulitis. A cut (incision) by the surgeon was made over your abscess and the pus was drained out. Gauze may have been packed into the space to provide a drain that will allow the cavity to heal from the inside outwards. The boil may be painful for 5 to 7 days. Most people with a boil do not have high fevers. Your abscess, if seen early, may not have localized, and may not have been lanced. If not, another appointment may be required for this if it does not get better on its own or with medications. HOME CARE INSTRUCTIONS   Only take over-the-counter or prescription medicines for pain, discomfort, or fever as directed by your caregiver.  When you bathe, soak and then remove gauze or iodoform packs at least daily or as directed by your caregiver. You may then wash the wound gently with mild soapy water. Repack with gauze or do as your caregiver directs. SEEK IMMEDIATE MEDICAL CARE IF:   You develop increased pain, swelling, redness, drainage, or bleeding in the wound site.  You develop signs of generalized infection including muscle aches, chills, fever, or a general ill feeling.  An oral temperature above 102 F (38.9 C) develops, not controlled by  medication. See your caregiver for a recheck if you develop any of the symptoms described above. If medications (antibiotics) were prescribed, take them as directed. Document Released: 11/19/2004 Document Revised: 07/26/2011 Document Reviewed: 07/17/2007 [facility] Patient Information 2015 [place], [place]. This information is not intended to replace advice given to you by your health care provider. Make sure you discuss any questions you have with your health care provider.  Abscess An abscess is an infected area that contains a collection of pus and debris.It can occur in almost any part of the body. An abscess is also known as a furuncle or boil. CAUSES  An abscess occurs when tissue gets infected. This can occur from blockage of oil or sweat glands, infection of hair follicles, or a minor injury to the skin. As the body tries to fight the infection, pus collects in the area and creates pressure under the skin. This pressure causes pain. People with weakened immune systems have difficulty fighting infections and get certain abscesses more often.  SYMPTOMS Usually an abscess develops on the skin and becomes a painful mass that is red, warm, and tender. If the abscess forms under the skin, you may feel a moveable soft area under the skin. Some abscesses break open (rupture) on their own, but most will continue to get worse without care. The infection can spread deeper into the body and eventually into the bloodstream, causing you to feel ill.  DIAGNOSIS  Your caregiver will take your medical history and perform a  physical exam. A sample of fluid may also be taken from the abscess to determine what is causing your infection. TREATMENT  Your caregiver may prescribe antibiotic medicines to fight the infection. However, taking antibiotics alone usually does not cure an abscess. Your caregiver may need to make a small cut (incision) in the abscess to drain the pus. In some cases, gauze is packed into the  abscess to reduce pain and to continue draining the area. HOME CARE INSTRUCTIONS   Only take over-the-counter or prescription medicines for pain, discomfort, or fever as directed by your caregiver.  If you were prescribed antibiotics, take them as directed. Finish them even if you start to feel better.  If gauze is used, follow your caregiver's directions for changing the gauze.  To avoid spreading the infection:  Keep your draining abscess covered with a bandage.  Wash your hands well.  Do not share personal care items, towels, or whirlpools with others.  Avoid skin contact with others.  Keep your skin and clothes clean around the abscess.  Keep all follow-up appointments as directed by your caregiver. SEEK MEDICAL CARE IF:   You have increased pain, swelling, redness, fluid drainage, or bleeding.  You have muscle aches, chills, or a general ill feeling.  You have a fever. MAKE SURE YOU:   Understand these instructions.  Will watch your condition.  Will get help right away if you are not doing well or get worse. Document Released: 02/10/2005 Document Revised: 11/02/2011 Document Reviewed: 07/16/2011 [facility] Patient Information 2015 [place], [place]. This information is not intended to replace advice given to you by your health care provider. Make sure you discuss any questions you have with your health care provider.  Cellulitis Cellulitis is an infection of the skin and the tissue beneath it. The infected area is usually red and tender. Cellulitis occurs most often in the arms and lower legs.  CAUSES  Cellulitis is caused by bacteria that enter the skin through cracks or cuts in the skin. The most common types of bacteria that cause cellulitis are staphylococci and streptococci. SIGNS AND SYMPTOMS   Redness and warmth.  Swelling.  Tenderness or pain.  Fever. DIAGNOSIS  Your health care provider can usually determine what is wrong based on a physical exam. Blood  tests may also be done. TREATMENT  Treatment usually involves taking an antibiotic medicine. HOME CARE INSTRUCTIONS   Take your antibiotic medicine as directed by your health care provider. Finish the antibiotic even if you start to feel better.  Keep the infected arm or leg elevated to reduce swelling.  Apply a warm cloth to the affected area up to 4 times per day to relieve pain.  Take medicines only as directed by your health care provider.  Keep all follow-up visits as directed by your health care provider. SEEK MEDICAL CARE IF:   You notice red streaks coming from the infected area.  Your red area gets larger or turns dark in color.  Your bone or joint underneath the infected area becomes painful after the skin has healed.  Your infection returns in the same area or another area.  You notice a swollen bump in the infected area.  You develop new symptoms.  You have a fever. SEEK IMMEDIATE MEDICAL CARE IF:   You feel very sleepy.  You develop vomiting or diarrhea.  You have a general ill feeling (malaise) with muscle aches and pains. MAKE SURE YOU:   Understand these instructions.  Will watch your condition.  Will get help right away if you are not doing well or get worse. Document Released: 02/10/2005 Document Revised: 09/17/2013 Document Reviewed: 07/19/2011 [facility] Patient Information 2015 [place], [place]. This information is not intended to replace advice given to you by your health care provider. Make sure you discuss any questions you have with your health care provider.

## 2015-02-10 NOTE — ED Notes (Signed)
Pt here for abscess to left lower leg. Denies drainage.

## 2015-02-11 ENCOUNTER — Encounter (HOSPITAL_BASED_OUTPATIENT_CLINIC_OR_DEPARTMENT_OTHER): Payer: Self-pay | Admitting: Emergency Medicine

## 2015-03-17 ENCOUNTER — Emergency Department (HOSPITAL_COMMUNITY): Payer: No Typology Code available for payment source

## 2015-03-17 ENCOUNTER — Encounter (HOSPITAL_COMMUNITY): Payer: Self-pay

## 2015-03-17 ENCOUNTER — Emergency Department (HOSPITAL_COMMUNITY)
Admission: EM | Admit: 2015-03-17 | Discharge: 2015-03-17 | Disposition: A | Discharge status: Home / Self Care | Payer: No Typology Code available for payment source | Attending: Emergency Medicine | Admitting: Emergency Medicine

## 2015-03-17 DIAGNOSIS — Z720 Tobacco use: Secondary | ICD-10-CM | POA: Insufficient documentation

## 2015-03-17 DIAGNOSIS — Y998 Other external cause status: Secondary | ICD-10-CM | POA: Insufficient documentation

## 2015-03-17 DIAGNOSIS — S199XXA Unspecified injury of neck, initial encounter: Secondary | ICD-10-CM | POA: Diagnosis not present

## 2015-03-17 DIAGNOSIS — S0990XA Unspecified injury of head, initial encounter: Secondary | ICD-10-CM | POA: Diagnosis not present

## 2015-03-17 DIAGNOSIS — Y9389 Activity, other specified: Secondary | ICD-10-CM | POA: Insufficient documentation

## 2015-03-17 DIAGNOSIS — Y92410 Unspecified street and highway as the place of occurrence of the external cause: Secondary | ICD-10-CM | POA: Insufficient documentation

## 2015-03-17 MED ORDER — IBUPROFEN 800 MG PO TABS: 800.0000 mg | ORAL_TABLET | Freq: Three times a day (TID) | ORAL | Status: AC

## 2015-03-17 MED ORDER — METHOCARBAMOL 500 MG PO TABS: 1000.0000 mg | ORAL_TABLET | Freq: Four times a day (QID) | ORAL | Status: AC | PRN

## 2015-03-17 MED ORDER — HYDROCODONE-ACETAMINOPHEN 5-325 MG PO TABS
1.0000 | ORAL_TABLET | Freq: Once | ORAL | Status: AC
  Administered 2015-03-17: 1 via ORAL
  Filled 2015-03-17: qty 1
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
## 2015-03-17 NOTE — Progress Notes (Signed)
Orthopedic Tech Progress Note Patient Details:  Timothy Sullivan 06/30/1985 [id number]  Ortho Devices Type of Ortho Device: Crutches Ortho Device/Splint Interventions: Application   [name] 03/17/2015, 9:31 AM
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
